# Patient Record
Sex: Female | Born: 2000 | Race: Black or African American | Hispanic: No | Marital: Single | Smoking: Current some day smoker
Health system: Southern US, Community
[De-identification: ages and names within clinical notes are randomized; demographics above are authoritative.]

## PROBLEM LIST (undated history)

## (undated) ENCOUNTER — Emergency Department (HOSPITAL_COMMUNITY): Payer: No Typology Code available for payment source

## (undated) DIAGNOSIS — J45909 Unspecified asthma, uncomplicated: Secondary | ICD-10-CM

---
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
## 2020-04-12 ENCOUNTER — Emergency Department (HOSPITAL_COMMUNITY): Payer: Medicaid Other

## 2020-04-12 ENCOUNTER — Other Ambulatory Visit: Payer: Self-pay

## 2020-04-12 ENCOUNTER — Encounter (HOSPITAL_COMMUNITY): Payer: Self-pay | Admitting: Emergency Medicine

## 2020-04-12 ENCOUNTER — Emergency Department (HOSPITAL_COMMUNITY)
Admission: EM | Admit: 2020-04-12 | Discharge: 2020-04-13 | Disposition: A | Discharge status: Home / Self Care | Payer: Medicaid Other | Attending: Emergency Medicine | Admitting: Emergency Medicine

## 2020-04-12 DIAGNOSIS — Y9289 Other specified places as the place of occurrence of the external cause: Secondary | ICD-10-CM | POA: Insufficient documentation

## 2020-04-12 DIAGNOSIS — R55 Syncope and collapse: Secondary | ICD-10-CM | POA: Diagnosis present

## 2020-04-12 DIAGNOSIS — S022XXA Fracture of nasal bones, initial encounter for closed fracture: Secondary | ICD-10-CM | POA: Insufficient documentation

## 2020-04-12 DIAGNOSIS — S0990XA Unspecified injury of head, initial encounter: Secondary | ICD-10-CM | POA: Diagnosis not present

## 2020-04-12 DIAGNOSIS — Y9389 Activity, other specified: Secondary | ICD-10-CM | POA: Diagnosis not present

## 2020-04-12 DIAGNOSIS — Y999 Unspecified external cause status: Secondary | ICD-10-CM | POA: Insufficient documentation

## 2020-04-12 LAB — BASIC METABOLIC PANEL
Anion gap: 10 (ref 5–15)
BUN: 10 mg/dL (ref 6–20)
CO2: 23 mmol/L (ref 22–32)
Calcium: 9.3 mg/dL (ref 8.9–10.3)
Chloride: 106 mmol/L (ref 98–111)
Creatinine, Ser: 0.63 mg/dL (ref 0.44–1.00)
GFR calc Af Amer: >60 mL/min (ref >60)
GFR calc non Af Amer: >60 mL/min (ref >60)
Glucose, Bld: 97 mg/dL (ref 70–99)
Potassium: 3.8 mmol/L (ref 3.5–5.1)
Sodium: 139 mmol/L (ref 135–145)

## 2020-04-12 LAB — CBC WITH DIFFERENTIAL/PLATELET
Abs Immature Granulocytes: 0.02 10*3/uL (ref 0.00–0.07)
Basophils Absolute: 0 10*3/uL (ref 0.0–0.1)
Basophils Relative: 0 %
Eosinophils Absolute: 0.2 10*3/uL (ref 0.0–0.5)
Eosinophils Relative: 2 %
HCT: 36.6 % (ref 36.0–46.0)
Hemoglobin: 11.9 g/dL — ABNORMAL LOW (ref 12.0–15.0)
Immature Granulocytes: 0 %
Lymphocytes Relative: 25 %
Lymphs Abs: 1.9 10*3/uL (ref 0.7–4.0)
MCH: 29 pg (ref 26.0–34.0)
MCHC: 32.5 g/dL (ref 30.0–36.0)
MCV: 89.3 fL (ref 80.0–100.0)
Monocytes Absolute: 0.6 10*3/uL (ref 0.1–1.0)
Monocytes Relative: 7 %
Neutro Abs: 5 10*3/uL (ref 1.7–7.7)
Neutrophils Relative %: 66 %
Platelets: 267 10*3/uL (ref 150–400)
RBC: 4.1 MIL/uL (ref 3.87–5.11)
RDW: 13.2 % (ref 11.5–15.5)
WBC: 7.7 10*3/uL (ref 4.0–10.5)
nRBC: 0 % (ref 0.0–0.2)

## 2020-04-12 LAB — I-STAT BETA HCG BLOOD, ED (MC, WL, AP ONLY): I-stat hCG, quantitative: <5 m[IU]/mL (ref <5)

## 2020-04-12 NOTE — Discharge Instructions (Addendum)
Return for any problem.  Follow-up with your regular care provider as instructed.  Apply ice to injured areas for pain control.  Use ibuprofen - 600 mg taken every 8 hours - as needed for pain control.  Follow-up with ENT for further care of your nasal fractures as instructed.
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
## 2020-04-13 MED ORDER — ACETAMINOPHEN 500 MG PO TABS
1000.0000 mg | ORAL_TABLET | Freq: Once | ORAL | Status: AC
  Administered 2020-04-13: 1000 mg via ORAL
  Filled 2020-04-13: qty 2
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
## 2021-05-01 ENCOUNTER — Encounter (HOSPITAL_COMMUNITY): Payer: Self-pay | Admitting: Pharmacy Technician

## 2021-05-01 ENCOUNTER — Other Ambulatory Visit: Payer: Self-pay

## 2021-05-01 ENCOUNTER — Emergency Department (HOSPITAL_COMMUNITY)
Admission: EM | Admit: 2021-05-01 | Discharge: 2021-05-01 | Disposition: A | Discharge status: Home / Self Care | Payer: Medicaid Other | Attending: Emergency Medicine | Admitting: Emergency Medicine

## 2021-05-01 DIAGNOSIS — N760 Acute vaginitis: Secondary | ICD-10-CM | POA: Diagnosis not present

## 2021-05-01 DIAGNOSIS — R000 Tachycardia, unspecified: Secondary | ICD-10-CM | POA: Insufficient documentation

## 2021-05-01 DIAGNOSIS — Z711 Person with feared health complaint in whom no diagnosis is made: Secondary | ICD-10-CM

## 2021-05-01 DIAGNOSIS — R109 Unspecified abdominal pain: Secondary | ICD-10-CM | POA: Insufficient documentation

## 2021-05-01 DIAGNOSIS — B9689 Other specified bacterial agents as the cause of diseases classified elsewhere: Secondary | ICD-10-CM | POA: Diagnosis not present

## 2021-05-01 DIAGNOSIS — N938 Other specified abnormal uterine and vaginal bleeding: Secondary | ICD-10-CM | POA: Diagnosis present

## 2021-05-01 DIAGNOSIS — N3000 Acute cystitis without hematuria: Secondary | ICD-10-CM | POA: Insufficient documentation

## 2021-05-01 LAB — URINALYSIS, ROUTINE W REFLEX MICROSCOPIC
Bilirubin Urine: NEGATIVE
Glucose, UA: NEGATIVE mg/dL
Hgb urine dipstick: NEGATIVE
Ketones, ur: NEGATIVE mg/dL
Nitrite: POSITIVE — AB
Protein, ur: 30 mg/dL — AB
Specific Gravity, Urine: 1.023 (ref 1.005–1.030)
WBC, UA: >50 WBC/hpf — ABNORMAL HIGH (ref 0–5)
pH: 5 (ref 5.0–8.0)

## 2021-05-01 LAB — WET PREP, GENITAL
Sperm: NONE SEEN
Trich, Wet Prep: NONE SEEN
Yeast Wet Prep HPF POC: NONE SEEN

## 2021-05-01 LAB — PREGNANCY, URINE: Preg Test, Ur: NEGATIVE

## 2021-05-01 MED ORDER — METRONIDAZOLE 500 MG PO TABS: 500.0000 mg | ORAL_TABLET | Freq: Two times a day (BID) | ORAL | 0 refills | Status: DC

## 2021-05-01 MED ORDER — CEPHALEXIN 500 MG PO CAPS: 500.0000 mg | ORAL_CAPSULE | Freq: Two times a day (BID) | ORAL | 0 refills | Status: AC

## 2021-05-01 NOTE — ED Provider Notes (Signed)
Emergency Medicine Provider Triage Evaluation Note  Katherine Krause , a 20 y.o. female  was evaluated in triage.  Pt complains of vaginal discharge concern for UTI.  Symptoms began yesterday.  States that she recently started being sexually active with her boyfriend and he had to get checked out.  She denies any vomiting.  Review of Systems  Positive: Vaginal discharge, dysuria Negative: Nausea, vomiting  Physical Exam  BP 117/88 (BP Location: Left Arm)   Pulse (!) 103   Temp 99.3 F (37.4 C) (Oral)   Resp 16   SpO2 100%  Gen:   Awake, no distress Resp:  Normal effort MSK:   Moves extremities without difficulty Other:  Abdomen is soft  Medical Decision Making  Medically screening exam initiated at 12:33 PM.  Appropriate orders placed.  Katherine Krause was informed that the remainder of the evaluation will be completed by another provider, this initial triage assessment does not replace that evaluation, and the importance of remaining in the ED until their evaluation is complete.  Will order urinalysis, pregnancy test and will likely need a pelvic exam   Dietrich Pates, PA-C 05/01/21 1244    [name], MD 05/04/21 1356

## 2021-05-01 NOTE — Discharge Instructions (Signed)
Your work-up today showed that you likely have a UTI, as well as bacterial vaginosis which is not an STD.  Gonorrhea and Chlamydia tests are still pending.  You understand that by refusing the pelvic exam we could be missing more serious infection such as pelvic inflammatory disease, ovarian torsion, or tubo-ovarian abscess.  Please follow-up on your gonorrhea and Chlamydia test results via MyChart.  There are instructions on your discharge paperwork on how to download this app.  Please make [facility] with [facility] whose [facility] information provided in your discharge paperwork.  Take the Keflex for your UTI symptoms, and metronidazole for bacterial vaginosis.  Do not drink on this medication as will make you nauseous.  Return to the ER for any worsening pelvic pain, fevers, nausea, vomiting, or any other new or concerning symptoms

## 2021-05-01 NOTE — ED Notes (Signed)
Dc instructions reviewed with the pt.  PT verbalized understanding.  Pt Dc.

## 2021-05-01 NOTE — ED Triage Notes (Signed)
Pt here with lower abdominal cramping and yellow discharge X3 days. Denies pain/itching.

## 2021-05-01 NOTE — ED Provider Notes (Signed)
[facility] EMERGENCY DEPARTMENT Provider Note   CSN: [id number] Arrival date & time: 05/01/21  1210     History Chief Complaint  Patient presents with  . Vaginal Discharge    Katherine Krause is a 20 y.o. female.  HPI 20 year old female who presents to the ER with complaints of dysuria and yellowish vaginal discharge which has been ongoing for the last 2 days.  She states she has had a new actual partner with whom she had sexual intercourse with no protection over the weekend.  She noticed onset of symptoms over the last couple days, with dysuria and yellowish discharge.  She also endorses some occasional abdominal cramping.  She initially thought this was due to the onset of her menses but her menses have not yet started.  Denies any vaginal bleeding.  Denies any nausea or vomiting.  No prior history of STDs.  She does not have a PCP which is what brought her for evaluation in the ER today.    History reviewed. No pertinent past medical history.  There are no problems to display for this patient.   History reviewed. No pertinent surgical history.   OB History   No obstetric history on file.     No family history on file.  Social History   Tobacco Use  . Smoking status: Never Smoker  . Smokeless tobacco: Never Used  Substance Use Topics  . Alcohol use: Never  . Drug use: Never    Home Medications Prior to Admission medications   Medication Sig Start Date End Date Taking? Authorizing Provider  naproxen (NAPROSYN) 500 MG tablet Take 1 tablet (500 mg total) by mouth every 12 (twelve) hours as needed for mild pain, moderate pain or headache. Patient not taking: Reported on 04/12/2020 10/08/19   Antony Madura, PA-C    Allergies    Patient has no known allergies.  Review of Systems   Review of Systems  Genitourinary: Positive for dysuria, pelvic pain and vaginal discharge. Negative for difficulty urinating, enuresis, flank pain, frequency, genital  sores, hematuria and menstrual problem.    Physical Exam Updated Vital Signs BP 113/80 (BP Location: Left Arm)   Pulse (!) 104   Temp 99.9 F (37.7 C) (Oral)   Resp 16   SpO2 100%   Physical Exam Vitals reviewed.  Constitutional:      Appearance: Normal appearance.  HENT:     Head: Normocephalic and atraumatic.  Eyes:     General:        Right eye: No discharge.        Left eye: No discharge.     Extraocular Movements: Extraocular movements intact.     Conjunctiva/sclera: Conjunctivae normal.  Abdominal:     Tenderness: There is no abdominal tenderness.  Genitourinary:    Comments: Pt refusing pelvic exam Musculoskeletal:        General: No swelling. Normal range of motion.  Neurological:     General: No focal deficit present.     Mental Status: She is alert and oriented to person, place, and time.  Psychiatric:        Mood and Affect: Mood normal.        Behavior: Behavior normal.     ED Results / Procedures / Treatments   Labs (all labs ordered are listed, but only abnormal results are displayed) Labs Reviewed  WET PREP, GENITAL - Abnormal; Notable for the following components:      Result Value   Clue Cells Wet  Prep HPF POC PRESENT (*)    WBC, Wet Prep HPF POC MANY (*)    All other components within normal limits  URINALYSIS, ROUTINE W REFLEX MICROSCOPIC - Abnormal; Notable for the following components:   Color, Urine AMBER (*)    APPearance CLOUDY (*)    Protein, ur 30 (*)    Nitrite POSITIVE (*)    Leukocytes,Ua LARGE (*)    WBC, UA >50 (*)    Bacteria, UA MANY (*)    All other components within normal limits  URINE CULTURE  PREGNANCY, URINE  GC/CHLAMYDIA PROBE AMP ([place]) NOT AT [facility]    EKG None  Radiology No results found.  Procedures Procedures   Medications Ordered in ED Medications - No data to display  ED Course  I have reviewed the triage vital signs and the nursing notes.  Pertinent labs & imaging results that were  available during my care of the patient were reviewed by me and considered in my medical decision making (see chart for details).    MDM Rules/Calculators/A&P                          20 year old female presents to the ER with complaints of vaginal discharge and dysuria with occasional abdominal cramping.  She reports a new sexual partner this week with whom she had unprotected sex with.  On arrival, she has a borderline temperature of 99.3, mildly tachycardic.  Patient is refusing pelvic exam, I did discuss the risks of possibly missing PID/ovarian torsion/TOA without pelvic exam, she understands the risks of not having this test performed.  She prefers to self swab.  UA with positive nitrates, large leukocytes, more than 50 WBCs and many bacteria.  Sent for culture.  Wet prep with clue cells and many WBCs.  Pregnancy is negative.  GC chlamydia pending.  I did offer prophylactic treatment for gonorrhea chlamydia today, however the patient would rather wait for the results.  I encouraged her to follow-up with the results via the MyChart app.  Given borderline fever, there is slight concern for PID, however the patient's abdomen is otherwise soft and nontender.  She again is refusing pelvic exam and understands the risks of foregoing such portion of evaluation.  We will initiate Keflex for UTI, Flagyl for BV.  Will refer to women's clinic/health department.  We discussed strict return precautions.  She voiced understanding and is agreeable.  Stable for discharge.   Final Clinical Impression(s) / ED Diagnoses Final diagnoses:  BV (bacterial vaginosis)  Concern about STD in female without diagnosis  Acute cystitis without hematuria    Rx / DC Orders ED Discharge Orders    None       Leone Brand 05/01/21 1751    Tegeler, Canary Brim, MD 05/01/21 1754

## 2021-05-04 LAB — GC/CHLAMYDIA PROBE AMP (~~LOC~~) NOT AT ARMC
Chlamydia: POSITIVE — AB
Comment: NEGATIVE
Comment: NORMAL
Neisseria Gonorrhea: POSITIVE — AB

## 2021-05-04 LAB — URINE CULTURE: Culture: >=100000 — AB

## 2021-05-05 ENCOUNTER — Telehealth: Payer: Self-pay | Admitting: Emergency Medicine

## 2021-05-05 NOTE — Telephone Encounter (Signed)
Post ED Visit - Positive Culture Follow-up  Culture report reviewed by antimicrobial stewardship pharmacist: [facility] []  , Pharm.D. []  Enzo Bi, Pharm.D., BCPS AQ-ID []  , Pharm.D., BCPS []  Celedonio Miyamoto, .D., BCPS []  [place], .D., BCPS, AAHIVP []  Georgina Pillion, Pharm.D., BCPS, AAHIVP []  [address], PharmD, BCPS []  , PharmD, BCPS []  [place], PharmD, BCPS []  [address], PharmD []  , PharmD, BCPS []  Estella Husk, PharmD  Pharmacy Team []  Lysle Pearl, PharmD []  , PharmD []  Phillips Climes, PharmD []  , Rph []  Agapito Games) , PharmD []  Verlan Friends, PharmD []  , PharmD []  Mervyn Gay, PharmD []  , PharmD []  Vinnie Level, PharmD []  [facility], PharmD []  , PharmD []  Len Childs, PharmD   Positive urine culture Treated with cephalexin, organism sensitive to the same and no further patient follow-up is required at this time.  05/05/2021, 11:42 AM

## 2021-06-15 ENCOUNTER — Other Ambulatory Visit: Payer: Self-pay | Admitting: Physician Assistant

## 2021-06-15 DIAGNOSIS — R102 Pelvic and perineal pain unspecified side: Secondary | ICD-10-CM

## 2021-06-25 ENCOUNTER — Other Ambulatory Visit: Payer: Medicaid Other

## 2021-07-02 ENCOUNTER — Other Ambulatory Visit: Payer: Medicaid Other

## 2022-07-26 ENCOUNTER — Emergency Department (HOSPITAL_COMMUNITY)
Admission: EM | Admit: 2022-07-26 | Discharge: 2022-07-27 | Disposition: A | Discharge status: Home / Self Care | Payer: Medicaid Other | Attending: Emergency Medicine | Admitting: Emergency Medicine

## 2022-07-26 ENCOUNTER — Other Ambulatory Visit: Payer: Self-pay

## 2022-07-26 ENCOUNTER — Emergency Department (HOSPITAL_COMMUNITY): Payer: Medicaid Other

## 2022-07-26 ENCOUNTER — Encounter (HOSPITAL_COMMUNITY): Payer: Self-pay

## 2022-07-26 DIAGNOSIS — Z23 Encounter for immunization: Secondary | ICD-10-CM | POA: Diagnosis not present

## 2022-07-26 DIAGNOSIS — W108XXA Fall (on) (from) other stairs and steps, initial encounter: Secondary | ICD-10-CM | POA: Insufficient documentation

## 2022-07-26 DIAGNOSIS — S61219A Laceration without foreign body of unspecified finger without damage to nail, initial encounter: Secondary | ICD-10-CM

## 2022-07-26 DIAGNOSIS — S61011A Laceration without foreign body of right thumb without damage to nail, initial encounter: Secondary | ICD-10-CM | POA: Insufficient documentation

## 2022-07-26 DIAGNOSIS — S6991XA Unspecified injury of right wrist, hand and finger(s), initial encounter: Secondary | ICD-10-CM | POA: Diagnosis present

## 2022-07-26 MED ORDER — ACETAMINOPHEN 325 MG PO TABS: 650.0000 mg | ORAL_TABLET | Freq: Four times a day (QID) | ORAL | 0 refills | Status: AC | PRN

## 2022-07-26 MED ORDER — CEFADROXIL 500 MG PO CAPS: 500.0000 mg | ORAL_CAPSULE | Freq: Two times a day (BID) | ORAL | 0 refills | Status: AC

## 2022-07-26 MED ORDER — TETANUS-DIPHTH-ACELL PERTUSSIS 5-2.5-18.5 LF-MCG/0.5 IM SUSY
0.5000 mL | PREFILLED_SYRINGE | Freq: Once | INTRAMUSCULAR | Status: AC
  Administered 2022-07-26: 0.5 mL via INTRAMUSCULAR
  Filled 2022-07-26: qty 0.5

## 2022-07-26 MED ORDER — LIDOCAINE HCL (PF) 1 % IJ SOLN
30.0000 mL | Freq: Once | INTRAMUSCULAR | Status: AC
  Administered 2022-07-26: 30 mL
  Filled 2022-07-26: qty 30

## 2022-07-26 NOTE — Discharge Instructions (Addendum)
Please keep the wound clean by using warm soapy water to gently cleanse the wound. Please call the hand specialist tomorrow for your appointment time on [facility] 8/30. Please consider cessation of alcohol consumption.   It was a pleasure caring for you today in the emergency department.  Please return to the emergency department for any worsening or worrisome symptoms.

## 2022-07-26 NOTE — ED Triage Notes (Signed)
Patient reports fell while going up steps and cut right thumb on the metal of the steps

## 2022-07-26 NOTE — ED Provider Triage Note (Signed)
Emergency Medicine Provider Triage Evaluation Note  Katherine Krause , a 21 y.o. female  was evaluated in triage.  Pt complains of laceration to right thumb PTA. Notes that she fell going up the steps. No thinners. No meds tried PTA. UTD with tetanus.  Review of Systems  Positive: As per HPI Negative:   Physical Exam  BP 115/80 (BP Location: Left Arm)   Pulse 86   Temp 98.3 F (36.8 C) (Oral)   Resp 16   Ht 5\' 6"  (1.676 m)   Wt 73.9 kg   SpO2 100%   BMI 26.31 kg/m  Gen:   Awake, no distress   Resp:  Normal effort  MSK:   Moves extremities without difficulty  Other:  Laceration noted to right thumb. Unable to extend distal phalanx of right thumb. Sensation intact.   Medical Decision Making  Medically screening exam initiated at 6:13 PM.  Appropriate orders placed.  Katherine Krause was informed that the remainder of the evaluation will be completed by another provider, this initial triage assessment does not replace that evaluation, and the importance of remaining in the ED until their evaluation is complete.  Work-up initiated.    Katherine Krause A, PA-C 07/26/22 1818

## 2022-07-26 NOTE — ED Provider Notes (Signed)
[facility] EMERGENCY DEPARTMENT Provider Note   CSN: [id number] Arrival date & time: 07/26/22  1640     History  Chief Complaint  Patient presents with   Laceration    Katherine Krause is a 21 y.o. female.  Patient as above with significant medical history as below, including no sig medical history who presents to the ED with complaint of r thumb injury. Pt reports Saturday evening she was ambulating up some stairs and she slipped/fell. Hand went between the steps and she sustained an injury to the dorsum of right thumb. She was intoxicated. Denies head injury or LOC. She did wash the wound initially. She did not seek medical care at that time. She is RHD. She is having difficulty extending her thumb. No numbness or tingling. No nailbed damaged reported. No other injuries reported. Unsure of last tetanus shot.      History reviewed. No pertinent past medical history.  History reviewed. No pertinent surgical history.   The history is provided by the patient. No language interpreter was used.  Laceration Associated symptoms: no fever and no rash        Home Medications Prior to Admission medications   Medication Sig Start Date End Date Taking? Authorizing Provider  acetaminophen (TYLENOL) 325 MG tablet Take 2 tablets (650 mg total) by mouth every 6 (six) hours as needed. 07/26/22  Yes Tanda Rockers A, DO  cefadroxil (DURICEF) 500 MG capsule Take 1 capsule (500 mg total) by mouth 2 (two) times daily for 5 days. 07/26/22 07/31/22 Yes Tanda Rockers A, DO  metroNIDAZOLE (FLAGYL) 500 MG tablet Take 1 tablet (500 mg total) by mouth 2 (two) times daily. 05/01/21   Mare Ferrari, PA-C  naproxen (NAPROSYN) 500 MG tablet Take 1 tablet (500 mg total) by mouth every 12 (twelve) hours as needed for mild pain, moderate pain or headache. Patient not taking: Reported on 04/12/2020 10/08/19   Antony Madura, PA-C      Allergies    Patient has no known allergies.    Review of  Systems   Review of Systems  Constitutional:  Negative for activity change and fever.  HENT:  Negative for facial swelling and trouble swallowing.   Eyes:  Negative for discharge and redness.  Respiratory:  Negative for cough and shortness of breath.   Cardiovascular:  Negative for chest pain and palpitations.  Gastrointestinal:  Negative for abdominal pain and nausea.  Genitourinary:  Negative for dysuria and flank pain.  Musculoskeletal:  Negative for back pain and gait problem.  Skin:  Positive for wound. Negative for pallor and rash.  Neurological:  Negative for syncope and headaches.    Physical Exam Updated Vital Signs BP (!) 145/42   Pulse 68   Temp 98.3 F (36.8 C) (Oral)   Resp 16   Ht 5\' 6"  (1.676 m)   Wt 73.9 kg   SpO2 100%   BMI 26.31 kg/m  Physical Exam Vitals and nursing note reviewed.  Constitutional:      General: She is not in acute distress.    Appearance: Normal appearance.  HENT:     Head: Normocephalic and atraumatic. No raccoon eyes, Battle's sign, right periorbital erythema or left periorbital erythema.     Right Ear: External ear normal.     Left Ear: External ear normal.     Nose: Nose normal.     Mouth/Throat:     Mouth: Mucous membranes are moist.  Eyes:     General: No  scleral icterus.       Right eye: No discharge.        Left eye: No discharge.  Cardiovascular:     Rate and Rhythm: Normal rate and regular rhythm.     Pulses: Normal pulses.     Heart sounds: Normal heart sounds.  Pulmonary:     Effort: Pulmonary effort is normal. No respiratory distress.     Breath sounds: Normal breath sounds.  Abdominal:     General: Abdomen is flat.     Tenderness: There is no abdominal tenderness.  Musculoskeletal:        General: Normal range of motion.       Hands:     Cervical back: Normal range of motion.     Right lower leg: No edema.     Left lower leg: No edema.     Comments: Approx 2cm laceration to dorsum of thumb, upon investigation  am able to see tendon disruption. She is unable to extend thumb at DIP, flexion appears intact. Nailbed intact. Cap refill is intact distal to wound. Radial pulses are 2+ b/l. No pain with ROM of wrist or elbow, no other injuries notable on examination of hands.   Skin:    General: Skin is warm and dry.     Capillary Refill: Capillary refill takes less than 2 seconds.  Neurological:     Mental Status: She is alert and oriented to person, place, and time.     GCS: GCS eye subscore is 4. GCS verbal subscore is 5. GCS motor subscore is 6.  Psychiatric:        Mood and Affect: Mood normal.        Behavior: Behavior normal.      ED Results / Procedures / Treatments   Labs (all labs ordered are listed, but only abnormal results are displayed) Labs Reviewed - No data to display  EKG None  Radiology DG Finger Thumb Right  Result Date: 07/26/2022 CLINICAL DATA:  Laceration. EXAM: RIGHT THUMB 2+V COMPARISON:  None Available. FINDINGS: There is soft tissue laceration of the posterior aspect of the proximal thumb. There is no radiopaque foreign body. There is no evidence of fracture or dislocation. There is no evidence of arthropathy or other focal bone abnormality. IMPRESSION: 1. Soft tissue laceration of the proximal thumb.  No foreign body. 2. No acute fracture or dislocation. Electronically Signed   By: Darliss Cheney M.D.   On: 07/26/2022 19:00    Procedures .Marland KitchenLaceration Repair  Date/Time: 07/26/2022 11:24 PM  Performed by: Sloan Leiter, DO Authorized by: Sloan Leiter, DO   Consent:    Consent obtained:  Verbal   Consent given by:  Patient   Risks, benefits, and alternatives were discussed: yes     Risks discussed:  Infection, pain, poor cosmetic result and need for additional repair   Alternatives discussed:  Delayed treatment and observation Universal protocol:    Procedure explained and questions answered to patient or proxy's satisfaction: yes     Immediately prior to  procedure, a time out was called: yes     Patient identity confirmed:  Verbally with patient and arm band Anesthesia:    Anesthesia method:  Local infiltration   Local anesthetic:  Lidocaine 1% w/o epi Laceration details:    Location:  Finger   Finger location:  R thumb   Length (cm):  2.5   Depth (mm):  4 Pre-procedure details:    Preparation:  Patient was prepped and  draped in usual sterile fashion and imaging obtained to evaluate for foreign bodies Exploration:    Limited defect created (wound extended): no     Hemostasis achieved with:  Direct pressure   Imaging obtained: x-ray     Imaging outcome: foreign body not noted     Wound exploration: wound explored through full range of motion and entire depth of wound visualized     Wound extent: tendon damage     Tendon damage location:  Upper extremity   Upper extremity tendon damage location:  Finger extensor   Tendon repair plan:  Refer for evaluation   Contaminated: no   Treatment:    Area cleansed with:  Povidone-iodine and saline   Amount of cleaning:  Extensive   Irrigation solution:  Tap water   Irrigation method:  Tap   Debridement:  None   Undermining:  None Skin repair:    Repair method:  Sutures   Suture size:  4-0   Suture material:  Nylon   Suture technique:  Simple interrupted   Number of sutures:  1 Approximation:    Approximation:  Loose Repair type:    Repair type:  Simple Post-procedure details:    Dressing:  Antibiotic ointment and non-adherent dressing   Procedure completion:  Tolerated well, no immediate complications     Medications Ordered in ED Medications  Tdap (BOOSTRIX) injection 0.5 mL (0.5 mLs Intramuscular Given 07/26/22 2228)  lidocaine (PF) (XYLOCAINE) 1 % injection 30 mL (30 mLs Infiltration Given 07/26/22 2229)    ED Course/ Medical Decision Making/ A&P Clinical Course as of 07/26/22 2357  Mon Jul 26, 2022  2246 Duraceft (cefodroxil 500mg  q12 x5d) soapy water (unscented gentle  soap), 1 stitch, f/u dr handy Wednesday.  [SG]    Clinical Course User Index [SG] Monday, DO                           Medical Decision Making Risk OTC drugs. Prescription drug management.   This patient presents to the ED with chief complaint(s) of finger injury with pertinent past medical history of no sig medical history which further complicates the presenting complaint. The complaint involves an extensive differential diagnosis and also carries with it a high risk of complications and morbidity.    The differential diagnosis includes but not limited to laceration, contusion, fx, fb, tendon disruption, other acute etiologies considered. Serious etiologies were considered.   The initial plan is to XR, irrigate, d/w hand, repair    Additional history obtained: Additional history obtained from  na Records reviewed  prior ed visits, prior labs imaging   Independent labs interpretation:  The following labs were independently interpreted: n/a  Independent visualization of imaging: - I independently visualized the following imaging with scope of interpretation limited to determining acute life threatening conditions related to emergency care: thumb xr, which revealed no fb or fx  Cardiac monitoring was reviewed and interpreted by myself which shows n/a  Treatment and Reassessment: Wound cleaned and closed loosely, see procedure note  Consultation: - Consulted or discussed management/test interpretation w/ external professional: Dr Sloan Leiter ortho > recommend loosely close, start oral abs, soapy water washes, f/u in office on Wednesday.  Consideration for admission or further workup: Admission was considered    Pt with likely extensor tendon injury to right thumb (she is RHD). Loosely closed in ED after copious irrigation. Place in finger splint in extension, start oral abx, f/u with hand  specialist on Wednesday, RTED if worse.   The patient improved significantly and was  discharged in stable condition. Detailed discussions were had with the patient regarding current findings, and need for close f/u with PCP or on call doctor. The patient has been instructed to return immediately if the symptoms worsen in any way for re-evaluation. Patient verbalized understanding and is in agreement with current care plan. All questions answered prior to discharge.    Social Determinants of health: Social History   Tobacco Use   Smoking status: Never   Smokeless tobacco: Never  Substance Use Topics   Alcohol use: Never   Drug use: Never            Final Clinical Impression(s) / ED Diagnoses Final diagnoses:  Finger laceration involving tendon, initial encounter    Rx / DC Orders ED Discharge Orders          Ordered    cefadroxil (DURICEF) 500 MG capsule  2 times daily        07/26/22 2329    acetaminophen (TYLENOL) 325 MG tablet  Every 6 hours PRN        07/26/22 2329              Tanda Rockers A, DO 07/26/22 2357

## 2022-11-25 ENCOUNTER — Ambulatory Visit (HOSPITAL_COMMUNITY)
Admission: RE | Admit: 2022-11-25 | Discharge: 2022-11-25 | Disposition: A | Discharge status: Home / Self Care | Payer: Self-pay | Source: Ambulatory Visit | Attending: Internal Medicine | Admitting: Internal Medicine

## 2022-11-25 ENCOUNTER — Encounter (HOSPITAL_COMMUNITY): Payer: Self-pay | Admitting: Emergency Medicine

## 2022-11-25 VITALS — BP 113/75 | HR 82 | Temp 98.2°F | Resp 16

## 2022-11-25 DIAGNOSIS — Z202 Contact with and (suspected) exposure to infections with a predominantly sexual mode of transmission: Secondary | ICD-10-CM | POA: Insufficient documentation

## 2022-11-25 DIAGNOSIS — N898 Other specified noninflammatory disorders of vagina: Secondary | ICD-10-CM | POA: Insufficient documentation

## 2022-11-25 MED ORDER — AZITHROMYCIN 250 MG PO TABS
ORAL_TABLET | ORAL | Status: AC
  Filled 2022-11-25: qty 4

## 2022-11-25 MED ORDER — AZITHROMYCIN 250 MG PO TABS
1000.0000 mg | ORAL_TABLET | Freq: Once | ORAL | Status: AC
  Administered 2022-11-25: 1000 mg via ORAL

## 2022-11-25 NOTE — ED Triage Notes (Signed)
Pt had exposure to chlamydia. Pt has vaginal yellow discharge for a couple days.  Pt is pregnant but doesn't know how far along she is.

## 2022-11-25 NOTE — ED Provider Notes (Signed)
[facility]    CSN: [id number] Arrival date & time: 11/25/22  1241      History   Chief Complaint Chief Complaint  Patient presents with   appt 1   Vaginal Discharge    HPI Katherine Krause is a 21 y.o. female.   Patient presents to urgent care for evaluation of vaginal discharge that is yellow in color and thick.  Reports positive exposure to chlamydia and would like to be tested for all STDs today.  Denies vaginal itching, vaginal bleeding, vaginal odor, and abdominal pain.  Patient is currently pregnant but has not establish care with a OB/GYN for prenatal care yet.  She denies lower abdominal pain, abdominal cramping, back pain, vaginal spotting/bleeding, and fever/chills.  Last menstrual cycle was in the middle of November 2023.  She took a pregnancy test 2 weeks ago and it was positive.  She is not currently taking any prenatal vitamins.  No recent antibiotics.     Vaginal Discharge   No past medical history on file.  There are no problems to display for this patient.   No past surgical history on file.  OB History     Gravida  1   Para      Term      Preterm      AB      Living         SAB      IAB      Ectopic      Multiple      Live Births               Home Medications    Prior to Admission medications   Medication Sig Start Date End Date Taking? Authorizing Provider  acetaminophen (TYLENOL) 325 MG tablet Take 2 tablets (650 mg total) by mouth every 6 (six) hours as needed. 07/26/22   Sloan Leiter, DO  metroNIDAZOLE (FLAGYL) 500 MG tablet Take 1 tablet (500 mg total) by mouth 2 (two) times daily. 05/01/21   Mare Ferrari, PA-C    Family History No family history on file.  Social History Social History   Tobacco Use   Smoking status: Never   Smokeless tobacco: Never  Substance Use Topics   Alcohol use: Never   Drug use: Never     Allergies   Patient has no known allergies.   Review of Systems Review of  Systems  Genitourinary:  Positive for vaginal discharge.  Per HPI   Physical Exam Triage Vital Signs ED Triage Vitals [11/25/22 1302]  Enc Vitals Group     BP 113/75     Pulse Rate 82     Resp 16     Temp 98.2 F (36.8 C)     Temp Source Oral     SpO2 98 %     Weight      Height      Head Circumference      Peak Flow      Pain Score      Pain Loc      Pain Edu?      Excl. in GC?    No data found.  Updated Vital Signs BP 113/75 (BP Location: Right Arm)   Pulse 82   Temp 98.2 F (36.8 C) (Oral)   Resp 16   LMP 09/30/2022 (Approximate)   SpO2 98%   Visual Acuity Right Eye Distance:   Left Eye Distance:   Bilateral Distance:    Right  Eye Near:   Left Eye Near:    Bilateral Near:     Physical Exam Vitals and nursing note reviewed.  Constitutional:      Appearance: She is not ill-appearing or toxic-appearing.  HENT:     Head: Normocephalic and atraumatic.     Right Ear: Hearing and external ear normal.     Left Ear: Hearing and external ear normal.     Nose: Nose normal.     Mouth/Throat:     Lips: Pink.  Eyes:     General: Lids are normal. Vision grossly intact. Gaze aligned appropriately.     Extraocular Movements: Extraocular movements intact.     Conjunctiva/sclera: Conjunctivae normal.  Pulmonary:     Effort: Pulmonary effort is normal.  Genitourinary:    Comments: Deferred. Musculoskeletal:     Cervical back: Neck supple.  Skin:    General: Skin is warm and dry.     Capillary Refill: Capillary refill takes less than 2 seconds.     Findings: No rash.  Neurological:     General: No focal deficit present.     Mental Status: She is alert and oriented to person, place, and time. Mental status is at baseline.     Cranial Nerves: No dysarthria or facial asymmetry.  Psychiatric:        Mood and Affect: Mood normal.        Speech: Speech normal.        Behavior: Behavior normal.        Thought Content: Thought content normal.        Judgment:  Judgment normal.      UC Treatments / Results  Labs (all labs ordered are listed, but only abnormal results are displayed) Labs Reviewed  CERVICOVAGINAL ANCILLARY ONLY    EKG   Radiology No results found.  Procedures Procedures (including critical care time)  Medications Ordered in UC Medications  azithromycin (ZITHROMAX) tablet 1,000 mg (has no administration in time range)    Initial Impression / Assessment and Plan / UC Course  I have reviewed the triage vital signs and the nursing notes.  Pertinent labs & imaging results that were available during my care of the patient were reviewed by me and considered in my medical decision making (see chart for details).   1.  Exposure to chlamydia, vaginal discharge Patient is pregnant, therefore we will treat with azithromycin 1 g prophylactically for chlamydia infection due to symptoms.  Vaginal testing for other STIs is pending.  Patient declines HIV and syphilis testing.  Avoid sexual intercourse for 7 days while undergoing treatment for STD.  We will call patient in the next 2 to 3 days should she test positive for any other STDs and treat accordingly.  List of OB/GYN's in the area provided as well as list of medications that are safe in pregnancy.  Strict MAU precautions discussed.  Patient agreeable with plan.  Discussed physical exam and available lab work findings in clinic with patient.  Counseled patient regarding appropriate use of medications and potential side effects for all medications recommended or prescribed today. Discussed red flag signs and symptoms of worsening condition,when to call the PCP office, return to urgent care, and when to seek higher level of care in the emergency department. Patient verbalizes understanding and agreement with plan. All questions answered. Patient discharged in stable condition.   Final Clinical Impressions(s) / UC Diagnoses   Final diagnoses:  Exposure to chlamydia  Vaginal  discharge  Discharge Instructions      We treated you for chlamydia in the clinic today. Do not have a sexual intercourse for 7 days while this antibiotic works in your body to treat the infection. Your vaginal swab will come back in the next 2 to 3 days and we will call you with your results if you require further treatment at that time.  Start taking the prescribed prenatal vitamin daily.  Refer to the list of safe medications in pregnancy you were given at urgent care today and do not take any other medications over-the-counter other than what is on this list.  You may only take Tylenol for pain and discomfort/fever.  Do not take ibuprofen in pregnancy.   Drink plenty of water (at least 8 cups/day) to stay well-hydrated.   Call one of the following clinics to schedule an appointment for your pregnancy care.  Center for [employer] [facility]            [address], [place], [place] [address] [phone]   Center for [facility] at [facility]                                                            [address], [address], [place], [place], [address] [phone]   Center for [facility] at [facility] [address], [address], [place], [place], [address] [phone]   Center [facility] [address], [place], [place], [address] [phone]   Center for [facility] at [facility]                                [address] [place], [place], [place], [address] [phone]   Center for [facility] at [facility]                                   [address], [place], [place], [address] [phone]   Center for [facility] at [facility] [address], [address], [place], [place], [address]        If you develop any severe abdominal pain, severe low back pain, vaginal bleeding, or any other pregnancy related emergency, please go to the maternity  assessment unit located at entrance C of [facility].   ED Prescriptions   None    PDMP not reviewed this encounter.   Carlisle Beers, [place] 11/25/22 1335

## 2022-11-25 NOTE — Discharge Instructions (Signed)
We treated you for chlamydia in the clinic today. Do not have a sexual intercourse for 7 days while this antibiotic works in your body to treat the infection. Your vaginal swab will come back in the next 2 to 3 days and we will call you with your results if you require further treatment at that time.  Start taking the prescribed prenatal vitamin daily.  Refer to the list of safe medications in pregnancy you were given at urgent care today and do not take any other medications over-the-counter other than what is on this list.  You may only take Tylenol for pain and discomfort/fever.  Do not take ibuprofen in pregnancy.   Drink plenty of water (at least 8 cups/day) to stay well-hydrated.   Call one of the following clinics to schedule an appointment for your pregnancy care.  Center for [employer] [facility]            [address] [place], [place] [address] [phone]   Center for [facility] at [facility]                                                            [address], [place], [place], [address] [phone]   Center for [facility] at [facility] [address], [address], [place], [place], [address] [phone]   Center for [facility] at [facility] [address], [address], [place], [place], [address] [phone]   Center for [facility] at [facility]                                [address] [place], [place], [place], [address] [phone]   Center for [facility] at [facility]                                   [address] [place], [place], [address] [phone]   Center for [facility] at [facility] [address], [place], [place], [address]        If you develop any severe abdominal pain, severe low back pain, vaginal bleeding, or any other pregnancy related emergency, please go to the maternity assessment unit located at entrance C of [facility].

## 2022-11-26 ENCOUNTER — Telehealth (HOSPITAL_COMMUNITY): Payer: Self-pay | Admitting: Emergency Medicine

## 2022-11-26 LAB — CERVICOVAGINAL ANCILLARY ONLY
Bacterial Vaginitis (gardnerella): POSITIVE — AB
Candida Glabrata: NEGATIVE
Candida Vaginitis: NEGATIVE
Chlamydia: POSITIVE — AB
Comment: NEGATIVE
Comment: NEGATIVE
Comment: NEGATIVE
Comment: NEGATIVE
Comment: NEGATIVE
Comment: NORMAL
Neisseria Gonorrhea: NEGATIVE
Trichomonas: NEGATIVE

## 2022-11-26 MED ORDER — METRONIDAZOLE 0.75 % VA GEL: 1.0000 | Freq: Every day | VAGINAL | 0 refills | Status: AC

## 2022-12-31 ENCOUNTER — Encounter (HOSPITAL_COMMUNITY): Payer: Self-pay | Admitting: *Deleted

## 2022-12-31 ENCOUNTER — Inpatient Hospital Stay (HOSPITAL_COMMUNITY)
Admission: AD | Admit: 2022-12-31 | Discharge: 2023-01-02 | DRG: 779 | Disposition: A | Discharge status: Home / Self Care | Payer: Medicaid Other | Attending: Obstetrics and Gynecology | Admitting: Obstetrics and Gynecology

## 2022-12-31 ENCOUNTER — Inpatient Hospital Stay (HOSPITAL_COMMUNITY): Payer: Medicaid Other

## 2022-12-31 ENCOUNTER — Other Ambulatory Visit: Payer: Self-pay

## 2022-12-31 DIAGNOSIS — F17210 Nicotine dependence, cigarettes, uncomplicated: Secondary | ICD-10-CM | POA: Diagnosis present

## 2022-12-31 DIAGNOSIS — D649 Anemia, unspecified: Secondary | ICD-10-CM | POA: Diagnosis present

## 2022-12-31 DIAGNOSIS — O036 Delayed or excessive hemorrhage following complete or unspecified spontaneous abortion: Secondary | ICD-10-CM

## 2022-12-31 DIAGNOSIS — Z3A11 11 weeks gestation of pregnancy: Secondary | ICD-10-CM

## 2022-12-31 DIAGNOSIS — O0339 Incomplete spontaneous abortion with other complications: Principal | ICD-10-CM | POA: Diagnosis present

## 2022-12-31 DIAGNOSIS — D62 Acute posthemorrhagic anemia: Secondary | ICD-10-CM

## 2022-12-31 HISTORY — DX: Unspecified asthma, uncomplicated: J45.909

## 2022-12-31 LAB — URINALYSIS, ROUTINE W REFLEX MICROSCOPIC
Bilirubin Urine: NEGATIVE
Glucose, UA: NEGATIVE mg/dL
Hgb urine dipstick: NEGATIVE
Ketones, ur: NEGATIVE mg/dL
Nitrite: NEGATIVE
Protein, ur: NEGATIVE mg/dL
Specific Gravity, Urine: 1.019 (ref 1.005–1.030)
pH: 5 (ref 5.0–8.0)

## 2022-12-31 LAB — CBC
HCT: 19.3 % — ABNORMAL LOW (ref 36.0–46.0)
Hemoglobin: 6.8 g/dL — CL (ref 12.0–15.0)
MCH: 28.7 pg (ref 26.0–34.0)
MCHC: 35.2 g/dL (ref 30.0–36.0)
MCV: 81.4 fL (ref 80.0–100.0)
Platelets: 235 10*3/uL (ref 150–400)
RBC: 2.37 MIL/uL — ABNORMAL LOW (ref 3.87–5.11)
RDW: 15.6 % — ABNORMAL HIGH (ref 11.5–15.5)
WBC: 15.4 10*3/uL — ABNORMAL HIGH (ref 4.0–10.5)
nRBC: 0 % (ref 0.0–0.2)

## 2022-12-31 LAB — POCT PREGNANCY, URINE: Preg Test, Ur: POSITIVE — AB

## 2022-12-31 LAB — WET PREP, GENITAL
Clue Cells Wet Prep HPF POC: NONE SEEN
Sperm: NONE SEEN
Trich, Wet Prep: NONE SEEN
WBC, Wet Prep HPF POC: <10 (ref <10)
Yeast Wet Prep HPF POC: NONE SEEN

## 2022-12-31 LAB — PREPARE RBC (CROSSMATCH)

## 2022-12-31 LAB — ABO/RH: ABO/RH(D): A POS

## 2022-12-31 LAB — HCG, QUANTITATIVE, PREGNANCY: hCG, Beta Chain, Quant, S: 13442 m[IU]/mL — ABNORMAL HIGH (ref <5)

## 2022-12-31 MED ORDER — HYDROMORPHONE HCL 1 MG/ML IJ SOLN
1.0000 mg | Freq: Once | INTRAMUSCULAR | Status: AC
  Administered 2022-12-31: 1 mg via INTRAVENOUS
  Filled 2022-12-31: qty 1

## 2022-12-31 MED ORDER — SODIUM CHLORIDE 0.9 % IV SOLN
10.0000 mL/h | Freq: Once | INTRAVENOUS | Status: AC
  Administered 2022-12-31: 10 mL/h via INTRAVENOUS

## 2022-12-31 MED ORDER — PRENATAL MULTIVITAMIN CH
1.0000 | ORAL_TABLET | Freq: Every day | ORAL | Status: DC
Start: 2023-01-01 — End: 2023-01-02
  Administered 2023-01-01 – 2023-01-02 (×2): 1 via ORAL
  Filled 2022-12-31 (×2): qty 1

## 2022-12-31 MED ORDER — LACTATED RINGERS IV BOLUS
1000.0000 mL | Freq: Once | INTRAVENOUS | Status: AC
Start: 2022-12-31 — End: 2022-12-31
  Administered 2022-12-31: 1000 mL via INTRAVENOUS

## 2022-12-31 MED ORDER — LACTATED RINGERS IV BOLUS
1000.0000 mL | Freq: Once | INTRAVENOUS | Status: AC
  Administered 2022-12-31: 1000 mL via INTRAVENOUS

## 2022-12-31 MED ORDER — MISOPROSTOL 200 MCG PO TABS
1000.0000 ug | ORAL_TABLET | Freq: Once | ORAL | Status: AC
  Administered 2022-12-31: 1000 ug via RECTAL
  Filled 2022-12-31: qty 5

## 2022-12-31 MED ORDER — IBUPROFEN 600 MG PO TABS
600.0000 mg | ORAL_TABLET | Freq: Four times a day (QID) | ORAL | Status: DC | PRN
  Administered 2023-01-01 – 2023-01-02 (×4): 600 mg via ORAL
  Filled 2022-12-31 (×4): qty 1

## 2022-12-31 MED ORDER — LACTATED RINGERS IV SOLN: INTRAVENOUS | Status: DC

## 2022-12-31 MED ORDER — OXYCODONE-ACETAMINOPHEN 5-325 MG PO TABS
1.0000 | ORAL_TABLET | ORAL | Status: DC | PRN
  Administered 2023-01-01 (×2): 2 via ORAL
  Filled 2022-12-31 (×2): qty 2

## 2022-12-31 NOTE — MAU Note (Addendum)
CRITICAL VALUE STICKER  CRITICAL VALUE: HGB 6.8  RECEIVER (on-site recipient of call): Elray Mcgregor, RN   [place]ner NOTIFIED: 12/31/2022 1627  MESSENGER (representative from lab): [facility]   MD NOTIFIED: Jorje Guild, NP   TIME OF NOTIFICATION: 12/31/2022 1628  RESPONSE:  no new orders at this time

## 2022-12-31 NOTE — MAU Provider Note (Signed)
History     [id number]  Arrival date and time: 12/31/22 1412    Chief Complaint  Patient presents with   Dizziness   Possible Pregnancy   Abdominal Pain   Back Pain   Vaginal Bleeding     HPI Katherine Krause is a 22 y.o. at [redacted]w[redacted]d by LMP who presents for abdominal pain & vaginal bleeding. Has not been seen yet with this pregnancy. Reports heavy bleeding last night and believes she miscarried. Bleeding slowed down but has had increased abdominal pain since this morning. Reports 10/10 pain. States she passed out last night and she has been dizzy since then. Denies headache, fever, SOB, chest pain, dysuria.    OB History     Gravida  3   Para      Term      Preterm      AB  2   Living         SAB      IAB  2   Ectopic      Multiple      Live Births              Past Medical History:  Diagnosis Date   Asthma    when she was a child    History reviewed. No pertinent surgical history.  History reviewed. No pertinent family history.  No Known Allergies  No current facility-administered medications on file prior to encounter.   Current Outpatient Medications on File Prior to Encounter  Medication Sig Dispense Refill   acetaminophen (TYLENOL) 325 MG tablet Take 2 tablets (650 mg total) by mouth every 6 (six) hours as needed. 36 tablet 0     ROS Pertinent positives and negative per HPI, all others reviewed and negative  Physical Exam   BP 102/73   Pulse (!) 116   Temp 98.7 F (37.1 C) (Oral)   Resp 18   Ht 5\' 5"  (1.651 m)   Wt 72.2 kg   LMP 10/12/2022 (Approximate)   SpO2 100%   Breastfeeding Unknown   BMI 26.49 kg/m   Patient Vitals for the past 24 hrs:  BP Temp Temp src Pulse Resp SpO2 Height Weight  12/31/22 1754 -- -- -- (!) 116 -- -- -- --  12/31/22 1742 -- -- -- (!) 180 -- -- -- --  12/31/22 1702 102/73 -- -- (!) 108 -- -- -- --  12/31/22 1558 118/72 -- -- (!) 119 18 100 % -- --  12/31/22 1521 125/72 -- -- (!) 180 -- -- -- --   12/31/22 1518 120/72 -- -- (!) 118 -- -- -- --  12/31/22 1517 119/67 -- -- (!) 113 -- -- -- --  12/31/22 1431 120/82 98.7 F (37.1 C) Oral (!) 160 (!) 30 100 % 5\' 5"  (1.651 m) 72.2 kg    Physical Exam Vitals and nursing note reviewed. Exam conducted with a chaperone present.  Constitutional:      General: She is in acute distress.     Appearance: She is well-developed. She is not diaphoretic.  HENT:     Head: Normocephalic and atraumatic.  Cardiovascular:     Rate and Rhythm: Tachycardia present.  Pulmonary:     Effort: Pulmonary effort is normal. No respiratory distress.  Abdominal:     Palpations: Abdomen is soft.     Tenderness: There is generalized abdominal tenderness. There is no guarding or rebound.  Genitourinary:    Comments: Pelvic: copious amount of bright red blood. POC removed from  cervix. Bleeding scan after removal of tissue. Cervix visually closed, pink/smooth.  Skin:    General: Skin is warm and dry.  Neurological:     Mental Status: She is alert.       Labs Results for orders placed or performed during the hospital encounter of 12/31/22 (from the past 24 hour(s))  Urinalysis, Routine w reflex microscopic -Urine, Clean Catch     Status: Abnormal   Collection Time: 12/31/22  3:01 PM  Result Value Ref Range   Color, Urine YELLOW YELLOW   APPearance HAZY (A) CLEAR   Specific Gravity, Urine 1.019 1.005 - 1.030   pH 5.0 5.0 - 8.0   Glucose, UA NEGATIVE NEGATIVE mg/dL   Hgb urine dipstick NEGATIVE NEGATIVE   Bilirubin Urine NEGATIVE NEGATIVE   Ketones, ur NEGATIVE NEGATIVE mg/dL   Protein, ur NEGATIVE NEGATIVE mg/dL   Nitrite NEGATIVE NEGATIVE   Leukocytes,Ua SMALL (A) NEGATIVE   RBC / HPF 0-5 0 - 5 RBC/hpf   WBC, UA 11-20 0 - 5 WBC/hpf   Bacteria, UA RARE (A) NONE SEEN   Squamous Epithelial / HPF 0-5 0 - 5 /HPF   Mucus PRESENT   CBC     Status: Abnormal   Collection Time: 12/31/22  3:17 PM  Result Value Ref Range   WBC 15.4 (H) 4.0 - 10.5 K/uL    RBC 2.37 (L) 3.87 - 5.11 MIL/uL   Hemoglobin 6.8 (LL) 12.0 - 15.0 g/dL   HCT 19.3 (L) 36.0 - 46.0 %   MCV 81.4 80.0 - 100.0 fL   MCH 28.7 26.0 - 34.0 pg   MCHC 35.2 30.0 - 36.0 g/dL   RDW 15.6 (H) 11.5 - 15.5 %   Platelets 235 150 - 400 K/uL   nRBC 0.0 0.0 - 0.2 %  ABO/Rh     Status: None   Collection Time: 12/31/22  3:17 PM  Result Value Ref Range   ABO/RH(D)      A POS Performed at [facility], [address] [place], [place] [address]   Pregnancy, urine POC     Status: Abnormal   Collection Time: 12/31/22  3:19 PM  Result Value Ref Range   Preg Test, Ur POSITIVE (A) NEGATIVE  Wet prep, genital     Status: None   Collection Time: 12/31/22  4:04 PM   Specimen: PATH Cytology Cervicovaginal Ancillary Only  Result Value Ref Range   Yeast Wet Prep HPF POC NONE SEEN NONE SEEN   Trich, Wet Prep NONE SEEN NONE SEEN   Clue Cells Wet Prep HPF POC NONE SEEN NONE SEEN   WBC, Wet Prep HPF POC <10 <10   Sperm NONE SEEN     Imaging US OB LESS THAN 14 WEEKS WITH OB TRANSVAGINAL  Result Date: 12/31/2022 CLINICAL DATA:  Abdominal pain. Eleven weeks 3 days gestational age by LMP. EXAM: OBSTETRIC <14 WK [place] AND TRANSVAGINAL OB US TECHNIQUE: Both transabdominal and transvaginal ultrasound examinations were performed for complete evaluation of the gestation as well as the maternal uterus, adnexal regions, and pelvic cul-de-sac. Transvaginal technique was performed to assess early pregnancy. COMPARISON:  None Available. FINDINGS: Intrauterine gestational sac: None Yolk sac:  Not Visualized. Embryo:  Not Visualized. Cardiac Activity: Not Visualized. Maternal uterus/adnexae: The right ovary is normal measuring 2.4 cm x 1.5 cm x 2.2 cm. The left ovary is normal measuring 3.2 cm x 1.7 cm x 3.6 cm. Endometrium is thickened. There is fluid and heterogeneous debris/tissue in the cervix. IMPRESSION:  1. No viable intrauterine pregnancy identified. No finding suspicious for ectopic pregnancy. Normal  ovaries. 2. Thickened endometrium with fluid and debris/tissue in the cervical canal. Recommend obstetric follow-up, correlation with serial beta hCG, and follow-up ultrasound to exclude retained products of conception. Electronically Signed   By: Valetta Mole M.D.   On: 12/31/2022 16:29    MAU Course  Procedures Lab Orders         Wet prep, genital         CBC         hCG, quantitative, pregnancy         Urinalysis, Routine w reflex microscopic -Urine, Clean Catch         Pregnancy, urine POC     Meds ordered this encounter  Medications   lactated ringers bolus 1,000 mL   HYDROmorphone (DILAUDID) injection 1 mg   lactated ringers bolus 1,000 mL   HYDROmorphone (DILAUDID) injection 1 mg   Imaging Orders         US OB LESS THAN 14 WEEKS WITH OB TRANSVAGINAL      MDM Patient presents with abdominal pain & bleeding.  Ultrasound shows blood products & tissue in cervix.  On exam - copious amount of blood in vagina and tissue removed from cervix. Tissue sent to pathology. Minimal bleeding after removal.  She is RH positive.   Hemoglobin down to 6.8. Patient continues to be symptomatic after 2 liters of IV fluids. Discussed with Dr. Elly Modena. Will admit for blood transfusion.  Assessment and Plan   1. Anemia due to acute blood loss   2. Abortion, spontaneous with hemorrhage   3. [redacted] weeks gestation of pregnancy    -Admit to [facility] unit  Jorje Guild, NP 12/31/22 6:10 PM

## 2022-12-31 NOTE — MAU Note (Signed)
Mazell Wildes is a 22 y.o. at Unknown here in MAU reporting: had a miscarriage 2 days ago.  Didn't want to believe it was happening.  Continues to have pain and bleeding.  Passed out last night when going to the bathroom, no one was there with her, was just for a second, "just laid there because she couldn't get up. Never seen  or confirmed, was thinking of terminating preg, had not decided when this happened. +hpt 12/20. Was soaking pads, today has not been as bad, the pain is just really bad today LMP: mid November Onset of complaint: 2 days ago Pain score: 8/9 Vitals:   12/31/22 1431  BP: 120/82  Pulse: (!) 160  Resp: (!) 30  Temp: 98.7 F (37.1 C)  SpO2: 100%      Lab orders placed from triage:     Taken directly to rm

## 2022-12-31 NOTE — H&P (Signed)
[facility]TEPART[facility] ADMISSION HISTORY AND PHYSICAL NOTE   History of Present Illness: Katherine Krause is a 22 y.o. G3P0020 at [redacted]w[redacted]d admitted for symptomatic anemia after miscarriage. Patient seen in MAU for miscarriage. Passed POCs in MAU. Hemoglobin dropped to 6.8 (values >11.9 in previous visits) & patient remains symptomatic & orthostatic after IV fluids. Vaginal bleeding minimal after exam.   Patient Active Problem List   Diagnosis Date Noted   Symptomatic anemia 12/31/2022    Past Medical History:  Diagnosis Date   Asthma    when she was a child    History reviewed. No pertinent surgical history.  OB History  Gravida Para Term Preterm AB Living  3       2    SAB IAB Ectopic Multiple Live Births    2          # Outcome Date GA Lbr Len/2nd Weight Sex Delivery Anes PTL Lv  3 Current           2 IAB           1 IAB      TAB       Social History   Socioeconomic History   Marital status: Single    Spouse name: Not on file   Number of children: Not on file   Years of education: Not on file   Highest education level: Not on file  Occupational History   Not on file  Tobacco Use   Smoking status: Some Days    Types: Cigarettes, Cigars   Smokeless tobacco: Never  Substance and Sexual Activity   Alcohol use: Never   Drug use: Never   Sexual activity: Not on file  Other Topics Concern   Not on file  Social History Narrative   Not on file   Social Determinants of Health   Financial Resource Strain: Not on file  Food Insecurity: Not on file  Transportation Needs: Not on file  Physical Activity: Not on file  Stress: Not on file  Social Connections: Not on file    History reviewed. No pertinent family history.  No Known Allergies  Medications Prior to Admission  Medication Sig Dispense Refill Last Dose   acetaminophen (TYLENOL) 325 MG tablet Take 2 tablets (650 mg total) by mouth every 6 (six) hours as needed. 36 tablet 0     Review of Systems -  History obtained from the patient General ROS: negative for - chills or fever Respiratory ROS: negative for - shortness of breath Cardiovascular ROS: negative for - chest pain Gastrointestinal ROS: positive for - abdominal pain Genito-Urinary ROS: positive for - vaginal bleeding Neurological ROS: positive for - dizziness  Vitals:  BP 102/73   Pulse (!) 116   Temp 98.7 F (37.1 C) (Oral)   Resp 18   Ht 5\' 5"  (1.651 m)   Wt 72.2 kg   LMP 10/12/2022 (Approximate)   SpO2 100%   Breastfeeding Unknown   BMI 26.49 kg/m   Physical Examination: Vitals and nursing note reviewed. Exam conducted with a chaperone present.  Constitutional:      General: She is in acute distress.     Appearance: She is well-developed. She is not diaphoretic.  HENT:     Head: Normocephalic and atraumatic.  Cardiovascular:     Rate and Rhythm: Tachycardia present.  Pulmonary:     Effort: Pulmonary effort is normal. No respiratory distress.  Abdominal:     Palpations: Abdomen is soft.  Tenderness: There is generalized abdominal tenderness. There is no guarding or rebound.  Genitourinary:    Comments: Pelvic: copious amount of bright red blood. POC removed from cervix. Bleeding scan after removal of tissue. Cervix visually closed, pink/smooth.  Skin:    General: Skin is warm and dry.  Neurological:     Mental Status: She is alert  Labs:  Results for orders placed or performed during the hospital encounter of 12/31/22 (from the past 24 hour(s))  Urinalysis, Routine w reflex microscopic -Urine, Clean Catch   Collection Time: 12/31/22  3:01 PM  Result Value Ref Range   Color, Urine YELLOW YELLOW   APPearance HAZY (A) CLEAR   Specific Gravity, Urine 1.019 1.005 - 1.030   pH 5.0 5.0 - 8.0   Glucose, UA NEGATIVE NEGATIVE mg/dL   Hgb urine dipstick NEGATIVE NEGATIVE   Bilirubin Urine NEGATIVE NEGATIVE   Ketones, ur NEGATIVE NEGATIVE mg/dL   Protein, ur NEGATIVE NEGATIVE mg/dL   Nitrite NEGATIVE  NEGATIVE   Leukocytes,Ua SMALL (A) NEGATIVE   RBC / HPF 0-5 0 - 5 RBC/hpf   WBC, UA 11-20 0 - 5 WBC/hpf   Bacteria, UA RARE (A) NONE SEEN   Squamous Epithelial / HPF 0-5 0 - 5 /HPF   Mucus PRESENT   CBC   Collection Time: 12/31/22  3:17 PM  Result Value Ref Range   WBC 15.4 (H) 4.0 - 10.5 K/uL   RBC 2.37 (L) 3.87 - 5.11 MIL/uL   Hemoglobin 6.8 (LL) 12.0 - 15.0 g/dL   HCT 19.3 (L) 36.0 - 46.0 %   MCV 81.4 80.0 - 100.0 fL   MCH 28.7 26.0 - 34.0 pg   MCHC 35.2 30.0 - 36.0 g/dL   RDW 15.6 (H) 11.5 - 15.5 %   Platelets 235 150 - 400 K/uL   nRBC 0.0 0.0 - 0.2 %  ABO/Rh   Collection Time: 12/31/22  3:17 PM  Result Value Ref Range   ABO/RH(D)      A POS Performed at [facility], [address], [place], [place] [address]   Pregnancy, urine POC   Collection Time: 12/31/22  3:19 PM  Result Value Ref Range   Preg Test, Ur POSITIVE (A) NEGATIVE  Wet prep, genital   Collection Time: 12/31/22  4:04 PM   Specimen: PATH Cytology Cervicovaginal Ancillary Only  Result Value Ref Range   Yeast Wet Prep HPF POC NONE SEEN NONE SEEN   Trich, Wet Prep NONE SEEN NONE SEEN   Clue Cells Wet Prep HPF POC NONE SEEN NONE SEEN   WBC, Wet Prep HPF POC <10 <10   Sperm NONE SEEN     Imaging Studies: US OB LESS THAN 14 WEEKS WITH OB TRANSVAGINAL  Result Date: 12/31/2022 CLINICAL DATA:  Abdominal pain. Eleven weeks 3 days gestational age by LMP. EXAM: OBSTETRIC <14 WK [place] AND TRANSVAGINAL OB US TECHNIQUE: Both transabdominal and transvaginal ultrasound examinations were performed for complete evaluation of the gestation as well as the maternal uterus, adnexal regions, and pelvic cul-de-sac. Transvaginal technique was performed to assess early pregnancy. COMPARISON:  None Available. FINDINGS: Intrauterine gestational sac: None Yolk sac:  Not Visualized. Embryo:  Not Visualized. Cardiac Activity: Not Visualized. Maternal uterus/adnexae: The right ovary is normal measuring 2.4 cm x 1.5 cm x 2.2 cm. The  left ovary is normal measuring 3.2 cm x 1.7 cm x 3.6 cm. Endometrium is thickened. There is fluid and heterogeneous debris/tissue in the cervix. IMPRESSION: 1. No viable intrauterine  pregnancy identified. No finding suspicious for ectopic pregnancy. Normal ovaries. 2. Thickened endometrium with fluid and debris/tissue in the cervical canal. Recommend obstetric follow-up, correlation with serial beta hCG, and follow-up ultrasound to exclude retained products of conception. Electronically Signed   By: Valetta Mole M.D.   On: 12/31/2022 16:29     Assessment and Plan: 1. Anemia due to acute blood loss  -Will admit to [facility] unit for 2 units PRBCs  2. Abortion, spontaneous with hemorrhage  -Cytotec x 1 dose -Pathology report pending -Bleeding scant after exam  3. [redacted] weeks gestation of pregnancy     Jorje Guild, NP 12/31/2022 6:09 PM

## 2022-12-31 NOTE — MAU Note (Signed)
RN in room to assess bleeding, pain, and HR. Assisted pt to stand at the side of the bed. HR from 113 to 180. Small amount of bleeding. Pain 7/10. Jorje Guild NP informed.

## 2023-01-01 ENCOUNTER — Encounter (HOSPITAL_COMMUNITY): Payer: Self-pay | Admitting: *Deleted

## 2023-01-01 DIAGNOSIS — F17210 Nicotine dependence, cigarettes, uncomplicated: Secondary | ICD-10-CM | POA: Diagnosis present

## 2023-01-01 DIAGNOSIS — D649 Anemia, unspecified: Secondary | ICD-10-CM

## 2023-01-01 DIAGNOSIS — O0339 Incomplete spontaneous abortion with other complications: Secondary | ICD-10-CM | POA: Diagnosis present

## 2023-01-01 DIAGNOSIS — D62 Acute posthemorrhagic anemia: Secondary | ICD-10-CM | POA: Diagnosis present

## 2023-01-01 DIAGNOSIS — Z3A11 11 weeks gestation of pregnancy: Secondary | ICD-10-CM | POA: Diagnosis not present

## 2023-01-01 LAB — CBC
HCT: 20.2 % — ABNORMAL LOW (ref 36.0–46.0)
HCT: 20.5 % — ABNORMAL LOW (ref 36.0–46.0)
Hemoglobin: 7.1 g/dL — ABNORMAL LOW (ref 12.0–15.0)
Hemoglobin: 7.3 g/dL — ABNORMAL LOW (ref 12.0–15.0)
MCH: 29 pg (ref 26.0–34.0)
MCH: 29.1 pg (ref 26.0–34.0)
MCHC: 35.1 g/dL (ref 30.0–36.0)
MCHC: 35.6 g/dL (ref 30.0–36.0)
MCV: 82.4 fL (ref 80.0–100.0)
MCV: 81.7 fL (ref 80.0–100.0)
Platelets: 141 10*3/uL — ABNORMAL LOW (ref 150–400)
Platelets: 148 10*3/uL — ABNORMAL LOW (ref 150–400)
RBC: 2.45 MIL/uL — ABNORMAL LOW (ref 3.87–5.11)
RBC: 2.51 MIL/uL — ABNORMAL LOW (ref 3.87–5.11)
RDW: 15.2 % (ref 11.5–15.5)
RDW: 15.3 % (ref 11.5–15.5)
WBC: 10.9 10*3/uL — ABNORMAL HIGH (ref 4.0–10.5)
WBC: 10.5 10*3/uL (ref 4.0–10.5)
nRBC: 0 % (ref 0.0–0.2)
nRBC: 0 % (ref 0.0–0.2)

## 2023-01-01 MED ORDER — MISOPROSTOL 200 MCG PO TABS
800.0000 ug | ORAL_TABLET | Freq: Once | ORAL | Status: AC
  Administered 2023-01-01: 800 ug via VAGINAL
  Filled 2023-01-01: qty 4

## 2023-01-01 NOTE — Plan of Care (Signed)

## 2023-01-01 NOTE — Progress Notes (Addendum)
Assessment/Plan: Principal Problem:   Symptomatic anemia Incomplete AB  Repeat CBC in 4 hours If still bleeding may need further intervention Repeat cytotec  Subjective: Interval History:Still cramping and bleeding  Objective: Vital signs in last 24 hours: Temp:  [98 F (36.7 C)-98.8 F (37.1 C)] 98.8 F (37.1 C) (02/03 0757) Pulse Rate:  [91-180] 91 (02/03 0757) Resp:  [14-30] 14 (02/03 0757) BP: (96-125)/(59-86) 106/60 (02/03 0757) SpO2:  [99 %-100 %] 99 % (02/03 0757) Weight:  [72.2 kg] 72.2 kg (02/02 1431)  Intake/Output from previous day: 02/02 0701 - 02/03 0700 In: 1014.6 [P.O.:240; I.V.:138.1] Out: -  Intake/Output this shift: No intake/output data recorded.  Physical Examination: General appearance - alert, well appearing, and in no distress Chest - normal effort Heart - normal rate, regular rhythm, normal  Abdomen - soft, nontender, nondistended, no masses or organomegaly   Results for orders placed or performed during the hospital encounter of 12/31/22 (from the past 24 hour(s))  Urinalysis, Routine w reflex microscopic -Urine, Clean Catch     Status: Abnormal   Collection Time: 12/31/22  3:01 PM  Result Value Ref Range   Color, Urine YELLOW YELLOW   APPearance HAZY (A) CLEAR   Specific Gravity, Urine 1.019 1.005 - 1.030   pH 5.0 5.0 - 8.0   Glucose, UA NEGATIVE NEGATIVE mg/dL   Hgb urine dipstick NEGATIVE NEGATIVE   Bilirubin Urine NEGATIVE NEGATIVE   Ketones, ur NEGATIVE NEGATIVE mg/dL   Protein, ur NEGATIVE NEGATIVE mg/dL   Nitrite NEGATIVE NEGATIVE   Leukocytes,Ua SMALL (A) NEGATIVE   RBC / HPF 0-5 0 - 5 RBC/hpf   WBC, UA 11-20 0 - 5 WBC/hpf   Bacteria, UA RARE (A) NONE SEEN   Squamous Epithelial / HPF 0-5 0 - 5 /HPF   Mucus PRESENT   CBC     Status: Abnormal   Collection Time: 12/31/22  3:17 PM  Result Value Ref Range   WBC 15.4 (H) 4.0 - 10.5 K/uL   RBC 2.37 (L) 3.87 - 5.11 MIL/uL   Hemoglobin 6.8 (LL) 12.0 - 15.0 g/dL   HCT 19.3 (L)  36.0 - 46.0 %   MCV 81.4 80.0 - 100.0 fL   MCH 28.7 26.0 - 34.0 pg   MCHC 35.2 30.0 - 36.0 g/dL   RDW 15.6 (H) 11.5 - 15.5 %   Platelets 235 150 - 400 K/uL   nRBC 0.0 0.0 - 0.2 %  ABO/Rh     Status: None   Collection Time: 12/31/22  3:17 PM  Result Value Ref Range   ABO/RH(D)      A POS Performed at [facility], [address] [place], [place] [address]   hCG, quantitative, pregnancy     Status: Abnormal   Collection Time: 12/31/22  3:17 PM  Result Value Ref Range   hCG, Beta Chain, Quant, S 13,442 (H) <5 mIU/mL  Pregnancy, urine POC     Status: Abnormal   Collection Time: 12/31/22  3:19 PM  Result Value Ref Range   Preg Test, Ur POSITIVE (A) NEGATIVE  Wet prep, genital     Status: None   Collection Time: 12/31/22  4:04 PM   Specimen: PATH Cytology Cervicovaginal Ancillary Only  Result Value Ref Range   Yeast Wet Prep HPF POC NONE SEEN NONE SEEN   Trich, Wet Prep NONE SEEN NONE SEEN   Clue Cells Wet Prep HPF POC NONE SEEN NONE SEEN   WBC, Wet Prep HPF POC <10 <10  Sperm NONE SEEN   Type and screen [place]     Status: None (Preliminary result)   Collection Time: 12/31/22  6:50 PM  Result Value Ref Range   ABO/RH(D) A POS    Antibody Screen NEG    Sample Expiration 01/03/2023,2359    Unit Number [id number]    Blood Component Type RED CELLS,LR    Unit division 00    Status of Unit ISSUED    Transfusion Status OK TO TRANSFUSE    Crossmatch Result      Compatible Performed at [facility], [place] [address] [place], [place] [address]    Unit Number [id number]    Blood Component Type RED CELLS,LR    Unit division 00    Status of Unit ISSUED    Transfusion Status OK TO TRANSFUSE    Crossmatch Result Compatible   Prepare RBC (crossmatch)     Status: None   Collection Time: 12/31/22  6:52 PM  Result Value Ref Range   Order Confirmation      ORDER PROCESSED BY BLOOD BANK Performed at [facility], [place] [address]  [place], [place] [address]   CBC     Status: Abnormal   Collection Time: 01/01/23  4:26 AM  Result Value Ref Range   WBC 10.9 (H) 4.0 - 10.5 K/uL   RBC 2.45 (L) 3.87 - 5.11 MIL/uL   Hemoglobin 7.1 (L) 12.0 - 15.0 g/dL   HCT 20.2 (L) 36.0 - 46.0 %   MCV 82.4 80.0 - 100.0 fL   MCH 29.0 26.0 - 34.0 pg   MCHC 35.1 30.0 - 36.0 g/dL   RDW 15.2 11.5 - 15.5 %   Platelets 141 (L) 150 - 400 K/uL   nRBC 0.0 0.0 - 0.2 %    Studies/Results: US OB LESS THAN 14 WEEKS WITH OB TRANSVAGINAL  Result Date: 12/31/2022 CLINICAL DATA:  Abdominal pain. Eleven weeks 3 days gestational age by LMP. EXAM: OBSTETRIC <14 WK [place] AND TRANSVAGINAL OB US TECHNIQUE: Both transabdominal and transvaginal ultrasound examinations were performed for complete evaluation of the gestation as well as the maternal uterus, adnexal regions, and pelvic cul-de-sac. Transvaginal technique was performed to assess early pregnancy. COMPARISON:  None Available. FINDINGS: Intrauterine gestational sac: None Yolk sac:  Not Visualized. Embryo:  Not Visualized. Cardiac Activity: Not Visualized. Maternal uterus/adnexae: The right ovary is normal measuring 2.4 cm x 1.5 cm x 2.2 cm. The left ovary is normal measuring 3.2 cm x 1.7 cm x 3.6 cm. Endometrium is thickened. There is fluid and heterogeneous debris/tissue in the cervix. IMPRESSION: 1. No viable intrauterine pregnancy identified. No finding suspicious for ectopic pregnancy. Normal ovaries. 2. Thickened endometrium with fluid and debris/tissue in the cervical canal. Recommend obstetric follow-up, correlation with serial beta hCG, and follow-up ultrasound to exclude retained products of conception. Electronically Signed   By: Valetta Mole M.D.   On: 12/31/2022 16:29    Scheduled Meds:  prenatal multivitamin  1 tablet Oral Q1200   Continuous Infusions:  lactated ringers 75 mL/hr at 12/31/22 1841   PRN Meds:ibuprofen, oxyCODONE-acetaminophen    LOS: 0 days   Donnamae Jude, MD 01/01/2023 8:02 AM

## 2023-01-02 LAB — CBC WITH DIFFERENTIAL/PLATELET
Abs Immature Granulocytes: 0.04 10*3/uL (ref 0.00–0.07)
Basophils Absolute: 0 10*3/uL (ref 0.0–0.1)
Basophils Relative: 0 %
Eosinophils Absolute: 0.2 10*3/uL (ref 0.0–0.5)
Eosinophils Relative: 2 %
HCT: 23.6 % — ABNORMAL LOW (ref 36.0–46.0)
Hemoglobin: 8.4 g/dL — ABNORMAL LOW (ref 12.0–15.0)
Immature Granulocytes: 0 %
Lymphocytes Relative: 34 %
Lymphs Abs: 3.6 10*3/uL (ref 0.7–4.0)
MCH: 29.5 pg (ref 26.0–34.0)
MCHC: 35.6 g/dL (ref 30.0–36.0)
MCV: 82.8 fL (ref 80.0–100.0)
Monocytes Absolute: 0.7 10*3/uL (ref 0.1–1.0)
Monocytes Relative: 6 %
Neutro Abs: 5.9 10*3/uL (ref 1.7–7.7)
Neutrophils Relative %: 58 %
Platelets: 155 10*3/uL (ref 150–400)
RBC: 2.85 MIL/uL — ABNORMAL LOW (ref 3.87–5.11)
RDW: 14.9 % (ref 11.5–15.5)
WBC: 10.5 10*3/uL (ref 4.0–10.5)
nRBC: 0 % (ref 0.0–0.2)

## 2023-01-02 LAB — PREPARE RBC (CROSSMATCH)

## 2023-01-02 MED ORDER — ACETAMINOPHEN 325 MG PO TABS
650.0000 mg | ORAL_TABLET | Freq: Once | ORAL | Status: AC
  Administered 2023-01-02: 650 mg via ORAL
  Filled 2023-01-02: qty 2

## 2023-01-02 MED ORDER — DOXYCYCLINE HYCLATE 100 MG PO CAPS: 100.0000 mg | ORAL_CAPSULE | Freq: Two times a day (BID) | ORAL | 0 refills | Status: AC

## 2023-01-02 MED ORDER — FERROUS SULFATE 325 (65 FE) MG PO TABS: 325.0000 mg | ORAL_TABLET | ORAL | 1 refills | Status: AC

## 2023-01-02 MED ORDER — SODIUM CHLORIDE 0.9% IV SOLUTION: Freq: Once | INTRAVENOUS | Status: AC

## 2023-01-02 MED ORDER — DIPHENHYDRAMINE HCL 25 MG PO CAPS
25.0000 mg | ORAL_CAPSULE | Freq: Once | ORAL | Status: AC
  Administered 2023-01-02: 25 mg via ORAL
  Filled 2023-01-02: qty 1

## 2023-01-02 MED ORDER — IBUPROFEN 600 MG PO TABS: 600.0000 mg | ORAL_TABLET | Freq: Four times a day (QID) | ORAL | 0 refills | Status: AC | PRN

## 2023-01-02 NOTE — Discharge Summary (Signed)
Physician Discharge Summary  Patient ID: Katherine Krause MRN: [id number] DOB/AGE: [birth year] 22 y.o.  Admit date: 12/31/2022 Discharge date: 01/02/2023   Discharge Diagnoses:  Principal Problem:   Symptomatic anemia Incomplete AB  Consults: None  Significant Diagnostic Studies:  Results for orders placed or performed during the hospital encounter of 12/31/22 (from the past 24 hour(s))  CBC     Status: Abnormal   Collection Time: 01/01/23 11:53 AM  Result Value Ref Range   WBC 10.5 4.0 - 10.5 K/uL   RBC 2.51 (L) 3.87 - 5.11 MIL/uL   Hemoglobin 7.3 (L) 12.0 - 15.0 g/dL   HCT 20.5 (L) 36.0 - 46.0 %   MCV 81.7 80.0 - 100.0 fL   MCH 29.1 26.0 - 34.0 pg   MCHC 35.6 30.0 - 36.0 g/dL   RDW 15.3 11.5 - 15.5 %   Platelets 148 (L) 150 - 400 K/uL   nRBC 0.0 0.0 - 0.2 %  Prepare RBC (crossmatch)     Status: None   Collection Time: 01/02/23 12:03 AM  Result Value Ref Range   Order Confirmation      ORDER PROCESSED BY BLOOD BANK Performed at [facility], [address] [place], [place] [address]   CBC with Differential/Platelet     Status: Abnormal   Collection Time: 01/02/23  7:12 AM  Result Value Ref Range   WBC 10.5 4.0 - 10.5 K/uL   RBC 2.85 (L) 3.87 - 5.11 MIL/uL   Hemoglobin 8.4 (L) 12.0 - 15.0 g/dL   HCT 23.6 (L) 36.0 - 46.0 %   MCV 82.8 80.0 - 100.0 fL   MCH 29.5 26.0 - 34.0 pg   MCHC 35.6 30.0 - 36.0 g/dL   RDW 14.9 11.5 - 15.5 %   Platelets 155 150 - 400 K/uL   nRBC 0.0 0.0 - 0.2 %   Neutrophils Relative % 58 %   Neutro Abs 5.9 1.7 - 7.7 K/uL   Lymphocytes Relative 34 %   Lymphs Abs 3.6 0.7 - 4.0 K/uL   Monocytes Relative 6 %   Monocytes Absolute 0.7 0.1 - 1.0 K/uL   Eosinophils Relative 2 %   Eosinophils Absolute 0.2 0.0 - 0.5 K/uL   Basophils Relative 0 %   Basophils Absolute 0.0 0.0 - 0.1 K/uL   Immature Granulocytes 0 %   Abs Immature Granulocytes 0.04 0.00 - 0.07 K/uL     US OB LESS THAN 14 WEEKS WITH OB TRANSVAGINAL  Result Date:  12/31/2022 CLINICAL DATA:  Abdominal pain. Eleven weeks 3 days gestational age by LMP. EXAM: OBSTETRIC <14 WK [place] AND TRANSVAGINAL OB US TECHNIQUE: Both transabdominal and transvaginal ultrasound examinations were performed for complete evaluation of the gestation as well as the maternal uterus, adnexal regions, and pelvic cul-de-sac. Transvaginal technique was performed to assess early pregnancy. COMPARISON:  None Available. FINDINGS: Intrauterine gestational sac: None Yolk sac:  Not Visualized. Embryo:  Not Visualized. Cardiac Activity: Not Visualized. Maternal uterus/adnexae: The right ovary is normal measuring 2.4 cm x 1.5 cm x 2.2 cm. The left ovary is normal measuring 3.2 cm x 1.7 cm x 3.6 cm. Endometrium is thickened. There is fluid and heterogeneous debris/tissue in the cervix. IMPRESSION: 1. No viable intrauterine pregnancy identified. No finding suspicious for ectopic pregnancy. Normal ovaries. 2. Thickened endometrium with fluid and debris/tissue in the cervical canal. Recommend obstetric follow-up, correlation with serial beta hCG, and follow-up ultrasound to exclude retained products of conception. Electronically Signed   By: Valetta Mole  M.D.   On: 12/31/2022 16:29     Hospital Course: Patient presented with heavy vaginal bleeding with retained POC. POC in cervix and removed in MAU. She received 2 u PRBCs and Hgb went from 6.8-7.1 and was stable. Still felt dizzy and received 1 more unit. No further heavy vaginal bleeding. Felt stable for DC with doxycycline, ibuprofen and oral iron.    Disposition: Discharge disposition: 01-Home or Self Care       Discharged Condition: good  Discharge Instructions     Call MD for:  persistant nausea and vomiting   Complete by: As directed    Call MD for:  severe uncontrolled pain   Complete by: As directed    Call MD for:  temperature >100.4   Complete by: As directed    Diet - low sodium heart healthy   Complete by: As directed    Increase  activity slowly   Complete by: As directed       Allergies as of 01/02/2023   No Known Allergies      Medication List     TAKE these medications    acetaminophen 325 MG tablet Commonly known as: Tylenol Take 2 tablets (650 mg total) by mouth every 6 (six) hours as needed.   doxycycline 100 MG capsule Commonly known as: VIBRAMYCIN Take 1 capsule (100 mg total) by mouth 2 (two) times daily for 10 days.   ferrous sulfate 325 (65 FE) MG tablet Commonly known as: FerrouSul Take 1 tablet (325 mg total) by mouth every other day.   ibuprofen 600 MG tablet Commonly known as: ADVIL Take 1 tablet (600 mg total) by mouth every 6 (six) hours as needed (mild pain).        Follow-up Muse for [facility] Follow up.   Specialty: Obstetrics and Gynecology Why: Hospital follow-up, they will call you with an appointment Contact information: [address] [place] [phone]                Signed: Donnamae Jude 01/02/2023, 7:54 AM

## 2023-01-03 LAB — TYPE AND SCREEN
ABO/RH(D): A POS
Antibody Screen: NEGATIVE
Unit division: 0
Unit division: 0
Unit division: 0

## 2023-01-03 LAB — BPAM RBC
Blood Product Expiration Date: 202402282359
Blood Product Expiration Date: 202403012359
Blood Product Expiration Date: 202403012359
ISSUE DATE / TIME: 202402022027
ISSUE DATE / TIME: 202402030028
ISSUE DATE / TIME: 202402040027
Unit Type and Rh: 6200
Unit Type and Rh: 6200
Unit Type and Rh: 6200

## 2023-01-03 LAB — GC/CHLAMYDIA PROBE AMP (~~LOC~~) NOT AT ARMC
Chlamydia: NEGATIVE
Comment: NEGATIVE
Comment: NORMAL
Neisseria Gonorrhea: NEGATIVE

## 2023-01-04 LAB — SURGICAL PATHOLOGY

## 2023-01-17 ENCOUNTER — Ambulatory Visit: Payer: Medicaid Other | Admitting: Obstetrics and Gynecology
